# Patient Record
Sex: Male | Born: 1970 | Race: Black or African American | Hispanic: No | Marital: Single | Smoking: Never smoker
Health system: Southern US, Community
[De-identification: ages and names within clinical notes are randomized; demographics above are authoritative.]

## PROBLEM LIST (undated history)

## (undated) DIAGNOSIS — E079 Disorder of thyroid, unspecified: Secondary | ICD-10-CM

## (undated) DIAGNOSIS — I10 Essential (primary) hypertension: Secondary | ICD-10-CM

---

## 2012-03-02 ENCOUNTER — Emergency Department: Payer: Self-pay | Admitting: Emergency Medicine

## 2012-05-29 ENCOUNTER — Emergency Department: Payer: Self-pay | Admitting: *Deleted

## 2012-10-25 ENCOUNTER — Emergency Department: Payer: Self-pay | Admitting: Emergency Medicine

## 2013-09-01 ENCOUNTER — Emergency Department: Payer: Self-pay | Admitting: Emergency Medicine

## 2013-09-09 ENCOUNTER — Emergency Department: Payer: Self-pay | Admitting: Emergency Medicine

## 2013-11-28 ENCOUNTER — Emergency Department: Payer: Self-pay | Admitting: Emergency Medicine

## 2013-11-28 LAB — DRUG SCREEN, URINE
Amphetamines, Ur Screen: NEGATIVE (ref ?–1000)
BENZODIAZEPINE, UR SCRN: NEGATIVE (ref ?–200)
Barbiturates, Ur Screen: NEGATIVE (ref ?–200)
Cannabinoid 50 Ng, Ur ~~LOC~~: POSITIVE (ref ?–50)
Cocaine Metabolite,Ur ~~LOC~~: NEGATIVE (ref ?–300)
MDMA (Ecstasy)Ur Screen: NEGATIVE (ref ?–500)
Methadone, Ur Screen: NEGATIVE (ref ?–300)
OPIATE, UR SCREEN: NEGATIVE (ref ?–300)
Phencyclidine (PCP) Ur S: NEGATIVE (ref ?–25)
Tricyclic, Ur Screen: NEGATIVE (ref ?–1000)

## 2013-11-28 LAB — COMPREHENSIVE METABOLIC PANEL
ALBUMIN: 3.6 g/dL (ref 3.4–5.0)
ALK PHOS: 171 U/L — AB
ANION GAP: 5 — AB (ref 7–16)
BUN: 19 mg/dL — AB (ref 7–18)
Bilirubin,Total: 1.1 mg/dL — ABNORMAL HIGH (ref 0.2–1.0)
CALCIUM: 8.9 mg/dL (ref 8.5–10.1)
CO2: 28 mmol/L (ref 21–32)
CREATININE: 0.82 mg/dL (ref 0.60–1.30)
Chloride: 102 mmol/L (ref 98–107)
EGFR (African American): >60
EGFR (Non-African Amer.): >60
Glucose: 97 mg/dL (ref 65–99)
OSMOLALITY: 272 (ref 275–301)
Potassium: 3.9 mmol/L (ref 3.5–5.1)
SGOT(AST): 28 U/L (ref 15–37)
SGPT (ALT): 60 U/L (ref 12–78)
Sodium: 135 mmol/L — ABNORMAL LOW (ref 136–145)
TOTAL PROTEIN: 7.6 g/dL (ref 6.4–8.2)

## 2013-11-28 LAB — CBC WITH DIFFERENTIAL/PLATELET
BASOS PCT: 1.6 %
Basophil #: 0.1 10*3/uL (ref 0.0–0.1)
EOS ABS: 0.1 10*3/uL (ref 0.0–0.7)
EOS PCT: 1.2 %
HCT: 49.7 % (ref 40.0–52.0)
HGB: 16.8 g/dL (ref 13.0–18.0)
Lymphocyte #: 2.2 10*3/uL (ref 1.0–3.6)
Lymphocyte %: 43.2 %
MCH: 26.5 pg (ref 26.0–34.0)
MCHC: 33.9 g/dL (ref 32.0–36.0)
MCV: 78 fL — AB (ref 80–100)
Monocyte #: 0.8 x10 3/mm (ref 0.2–1.0)
Monocyte %: 15.1 %
Neutrophil #: 2 10*3/uL (ref 1.4–6.5)
Neutrophil %: 38.9 %
PLATELETS: 311 10*3/uL (ref 150–440)
RBC: 6.36 10*6/uL — AB (ref 4.40–5.90)
RDW: 13.2 % (ref 11.5–14.5)
WBC: 5 10*3/uL (ref 3.8–10.6)

## 2013-11-28 LAB — URINALYSIS, COMPLETE
BLOOD: NEGATIVE
Bacteria: NONE SEEN
Bilirubin,UR: NEGATIVE
Glucose,UR: NEGATIVE mg/dL (ref 0–75)
Nitrite: NEGATIVE
PH: 6 (ref 4.5–8.0)
Protein: 30
RBC,UR: 2 /HPF (ref 0–5)
Specific Gravity: 1.033 (ref 1.003–1.030)
Squamous Epithelial: 1

## 2013-11-28 LAB — T4, FREE: Free Thyroxine: 4.58 ng/dL — ABNORMAL HIGH (ref 0.76–1.46)

## 2013-11-28 LAB — TSH: Thyroid Stimulating Horm: <0.01 u[IU]/mL — ABNORMAL LOW

## 2013-11-28 LAB — TROPONIN I: TROPONIN-I: 0.02 ng/mL

## 2013-12-12 ENCOUNTER — Emergency Department: Payer: Self-pay | Admitting: Emergency Medicine

## 2013-12-12 LAB — BASIC METABOLIC PANEL
Anion Gap: 6 — ABNORMAL LOW (ref 7–16)
BUN: 18 mg/dL (ref 7–18)
CALCIUM: 8.4 mg/dL — AB (ref 8.5–10.1)
Chloride: 107 mmol/L (ref 98–107)
Co2: 27 mmol/L (ref 21–32)
Creatinine: 1 mg/dL (ref 0.60–1.30)
EGFR (Non-African Amer.): >60
GLUCOSE: 90 mg/dL (ref 65–99)
OSMOLALITY: 281 (ref 275–301)
POTASSIUM: 3.9 mmol/L (ref 3.5–5.1)
Sodium: 140 mmol/L (ref 136–145)

## 2013-12-12 LAB — CBC
HCT: 44.7 % (ref 40.0–52.0)
HGB: 15 g/dL (ref 13.0–18.0)
MCH: 26.4 pg (ref 26.0–34.0)
MCHC: 33.7 g/dL (ref 32.0–36.0)
MCV: 78 fL — ABNORMAL LOW (ref 80–100)
Platelet: 322 10*3/uL (ref 150–440)
RBC: 5.71 10*6/uL (ref 4.40–5.90)
RDW: 13.3 % (ref 11.5–14.5)
WBC: 5.9 10*3/uL (ref 3.8–10.6)

## 2013-12-12 LAB — TROPONIN I: Troponin-I: <0.02 ng/mL

## 2014-01-08 DIAGNOSIS — Z8639 Personal history of other endocrine, nutritional and metabolic disease: Secondary | ICD-10-CM | POA: Insufficient documentation

## 2014-01-08 DIAGNOSIS — F17200 Nicotine dependence, unspecified, uncomplicated: Secondary | ICD-10-CM | POA: Insufficient documentation

## 2014-07-17 ENCOUNTER — Emergency Department: Payer: Self-pay | Admitting: Emergency Medicine

## 2014-07-17 LAB — CBC
HCT: 49.3 % (ref 40.0–52.0)
HGB: 16.6 g/dL (ref 13.0–18.0)
MCH: 26.5 pg (ref 26.0–34.0)
MCHC: 33.7 g/dL (ref 32.0–36.0)
MCV: 79 fL — AB (ref 80–100)
Platelet: 284 10*3/uL (ref 150–440)
RBC: 6.26 10*6/uL — AB (ref 4.40–5.90)
RDW: 13.4 % (ref 11.5–14.5)
WBC: 15.2 10*3/uL — ABNORMAL HIGH (ref 3.8–10.6)

## 2014-07-17 LAB — COMPREHENSIVE METABOLIC PANEL
ALT: 44 U/L
Albumin: 3.5 g/dL (ref 3.4–5.0)
Alkaline Phosphatase: 161 U/L — ABNORMAL HIGH
Anion Gap: 6 — ABNORMAL LOW (ref 7–16)
BILIRUBIN TOTAL: 0.5 mg/dL (ref 0.2–1.0)
BUN: 10 mg/dL (ref 7–18)
Calcium, Total: 8.9 mg/dL (ref 8.5–10.1)
Chloride: 105 mmol/L (ref 98–107)
Co2: 26 mmol/L (ref 21–32)
Creatinine: 0.86 mg/dL (ref 0.60–1.30)
Glucose: 113 mg/dL — ABNORMAL HIGH (ref 65–99)
OSMOLALITY: 274 (ref 275–301)
Potassium: 3.8 mmol/L (ref 3.5–5.1)
SGOT(AST): 16 U/L (ref 15–37)
Sodium: 137 mmol/L (ref 136–145)
Total Protein: 6.9 g/dL (ref 6.4–8.2)

## 2014-07-17 LAB — TROPONIN I
TROPONIN-I: 0.02 ng/mL
Troponin-I: 0.03 ng/mL

## 2015-06-09 ENCOUNTER — Encounter: Payer: Self-pay | Admitting: Emergency Medicine

## 2015-06-09 ENCOUNTER — Emergency Department
Admission: EM | Admit: 2015-06-09 | Discharge: 2015-06-09 | Disposition: A | Discharge status: Home / Self Care | Payer: Self-pay | Attending: Emergency Medicine | Admitting: Emergency Medicine

## 2015-06-09 ENCOUNTER — Other Ambulatory Visit: Payer: Self-pay

## 2015-06-09 ENCOUNTER — Emergency Department: Payer: Self-pay

## 2015-06-09 DIAGNOSIS — R079 Chest pain, unspecified: Secondary | ICD-10-CM | POA: Insufficient documentation

## 2015-06-09 DIAGNOSIS — R5383 Other fatigue: Secondary | ICD-10-CM | POA: Insufficient documentation

## 2015-06-09 DIAGNOSIS — I10 Essential (primary) hypertension: Secondary | ICD-10-CM | POA: Insufficient documentation

## 2015-06-09 DIAGNOSIS — E0590 Thyrotoxicosis, unspecified without thyrotoxic crisis or storm: Secondary | ICD-10-CM | POA: Insufficient documentation

## 2015-06-09 DIAGNOSIS — M791 Myalgia, unspecified site: Secondary | ICD-10-CM

## 2015-06-09 HISTORY — DX: Disorder of thyroid, unspecified: E07.9

## 2015-06-09 HISTORY — DX: Essential (primary) hypertension: I10

## 2015-06-09 LAB — CBC
HEMATOCRIT: 50 % (ref 40.0–52.0)
Hemoglobin: 16.6 g/dL (ref 13.0–18.0)
MCH: 25.9 pg — ABNORMAL LOW (ref 26.0–34.0)
MCHC: 33.2 g/dL (ref 32.0–36.0)
MCV: 77.9 fL — AB (ref 80.0–100.0)
Platelets: 282 10*3/uL (ref 150–440)
RBC: 6.42 MIL/uL — ABNORMAL HIGH (ref 4.40–5.90)
RDW: 13.6 % (ref 11.5–14.5)
WBC: 4.9 10*3/uL (ref 3.8–10.6)

## 2015-06-09 LAB — BASIC METABOLIC PANEL
Anion gap: 6 (ref 5–15)
BUN: 16 mg/dL (ref 6–20)
CHLORIDE: 103 mmol/L (ref 101–111)
CO2: 25 mmol/L (ref 22–32)
Calcium: 9.3 mg/dL (ref 8.9–10.3)
Creatinine, Ser: 0.73 mg/dL (ref 0.61–1.24)
GFR calc Af Amer: >60 mL/min (ref >60)
GFR calc non Af Amer: >60 mL/min (ref >60)
Glucose, Bld: 105 mg/dL — ABNORMAL HIGH (ref 65–99)
POTASSIUM: 3.9 mmol/L (ref 3.5–5.1)
SODIUM: 134 mmol/L — AB (ref 135–145)

## 2015-06-09 LAB — TSH: TSH: 0.015 u[IU]/mL — ABNORMAL LOW (ref 0.350–4.500)

## 2015-06-09 LAB — TROPONIN I: Troponin I: <0.03 ng/mL (ref <0.031)

## 2015-06-09 NOTE — Discharge Instructions (Signed)
Chest Pain (Nonspecific) °It is often hard to give a specific diagnosis for the cause of chest pain. There is always a chance that your pain could be related to something serious, such as a heart attack or a blood clot in the lungs. You need to follow up with your health care provider for further evaluation. °CAUSES  °· Heartburn. °· Pneumonia or bronchitis. °· Anxiety or stress. °· Inflammation around your heart (pericarditis) or lung (pleuritis or pleurisy). °· A blood clot in the lung. °· A collapsed lung (pneumothorax). It can develop suddenly on its own (spontaneous pneumothorax) or from trauma to the chest. °· Shingles infection (herpes zoster virus). °The chest wall is composed of bones, muscles, and cartilage. Any of these can be the source of the pain. °· The bones can be bruised by injury. °· The muscles or cartilage can be strained by coughing or overwork. °· The cartilage can be affected by inflammation and become sore (costochondritis). °DIAGNOSIS  °Lab tests or other studies may be needed to find the cause of your pain. Your health care provider may have you take a test called an ambulatory electrocardiogram (ECG). An ECG records your heartbeat patterns over a 24-hour period. You may also have other tests, such as: °· Transthoracic echocardiogram (TTE). During echocardiography, sound waves are used to evaluate how blood flows through your heart. °· Transesophageal echocardiogram (TEE). °· Cardiac monitoring. This allows your health care provider to monitor your heart rate and rhythm in real time. °· Holter monitor. This is a portable device that records your heartbeat and can help diagnose heart arrhythmias. It allows your health care provider to track your heart activity for several days, if needed. °· Stress tests by exercise or by giving medicine that makes the heart beat faster. °TREATMENT  °· Treatment depends on what may be causing your chest pain. Treatment may include: °¨ Acid blockers for  heartburn. °¨ Anti-inflammatory medicine. °¨ Pain medicine for inflammatory conditions. °¨ Antibiotics if an infection is present. °· You may be advised to change lifestyle habits. This includes stopping smoking and avoiding alcohol, caffeine, and chocolate. °· You may be advised to keep your head raised (elevated) when sleeping. This reduces the chance of acid going backward from your stomach into your esophagus. °Most of the time, nonspecific chest pain will improve within 2-3 days with rest and mild pain medicine.  °HOME CARE INSTRUCTIONS  °· If antibiotics were prescribed, take them as directed. Finish them even if you start to feel better. °· For the next few days, avoid physical activities that bring on chest pain. Continue physical activities as directed. °· Do not use any tobacco products, including cigarettes, chewing tobacco, or electronic cigarettes. °· Avoid drinking alcohol. °· Only take medicine as directed by your health care provider. °· Follow your health care provider's suggestions for further testing if your chest pain does not go away. °· Keep any follow-up appointments you made. If you do not go to an appointment, you could develop lasting (chronic) problems with pain. If there is any problem keeping an appointment, call to reschedule. °SEEK MEDICAL CARE IF:  °· Your chest pain does not go away, even after treatment. °· You have a rash with blisters on your chest. °· You have a fever. °SEEK IMMEDIATE MEDICAL CARE IF:  °· You have increased chest pain or pain that spreads to your arm, neck, jaw, back, or abdomen. °· You have shortness of breath. °· You have an increasing cough, or you cough   up blood.  You have severe back or abdominal pain.  You feel nauseous or vomit.  You have severe weakness.  You faint.  You have chills. This is an emergency. Do not wait to see if the pain will go away. Get medical help at once. Call your local emergency services (911 in [place] Do not drive  yourself to the hospital. MAKE SURE YOU:   Understand these instructions.  Will watch your condition.  Will get help right away if you are not doing well or get worse. Document Released: 06/02/2005 Document Revised: 08/28/2013 Document Reviewed: 03/28/2008 [facility] Patient Information 2015 [place], [place]. This information is not intended to replace advice given to you by your health care provider. Make sure you discuss any questions you have with your health care provider.  Hyperthyroidism The thyroid is a large gland located in the lower front part of your neck. The thyroid helps control metabolism. Metabolism is how your body uses food. It controls metabolism with the hormone thyroxine. When the thyroid is overactive, it produces too much hormone. When this happens, these following problems may occur:   Nervousness  Heat intolerance  Weight loss (in spite of increase food intake)  Diarrhea  Change in hair or skin texture  Palpitations (heart skipping or having extra beats)  Tachycardia (rapid heart rate)  Loss of menstruation (amenorrhea)  Shaking of the hands CAUSES  Grave's Disease (the immune system attacks the thyroid gland). This is the most common cause.  Inflammation of the thyroid gland.  Tumor (usually benign) in the thyroid gland or elsewhere.  Excessive use of thyroid medications (both prescription and 'natural').  Excessive ingestion of Iodine. DIAGNOSIS  To prove hyperthyroidism, your caregiver may do blood tests and ultrasound tests. Sometimes the signs are hidden. It may be necessary for your caregiver to watch this illness with blood tests, either before or after diagnosis and treatment. TREATMENT Short-term treatment There are several treatments to control symptoms. Drugs called beta blockers may give some relief. Drugs that decrease hormone production will provide temporary relief in many people. These measures will usually not give permanent  relief. Definitive therapy There are treatments available which can be discussed between you and your caregiver which will permanently treat the problem. These treatments range from surgery (removal of the thyroid), to the use of radioactive iodine (destroys the thyroid by radiation), to the use of antithyroid drugs (interfere with hormone synthesis). The first two treatments are permanent and usually successful. They most often require hormone replacement therapy for life. This is because it is impossible to remove or destroy the exact amount of thyroid required to make a person euthyroid (normal). HOME CARE INSTRUCTIONS  See your caregiver if the problems you are being treated for get worse. Examples of this would be the problems listed above. SEEK MEDICAL CARE IF: Your general condition worsens. MAKE SURE YOU:   Understand these instructions.  Will watch your condition.  Will get help right away if you are not doing well or get worse. Document Released: 08/23/2005 Document Revised: 11/15/2011 Document Reviewed: 01/04/2007 [facility] Patient Information 2015 [place], [place]. This information is not intended to replace advice given to you by your health care provider. Make sure you discuss any questions you have with your health care provider.

## 2015-06-09 NOTE — ED Notes (Signed)
Pt to ED with c/o midsternal cp that is worse with inspiration, also having some upper back pain, states he is supposed to take thyroid medication but has not had any in a while

## 2015-06-09 NOTE — ED Provider Notes (Signed)
[facility] Emergency Department Provider Note  ____________________________________________  Time seen: 1726  I have reviewed the triage vital signs and the nursing notes.   HISTORY  Chief Complaint Chest Pain     HPI Jesse Sutton is a 44 y.o. male  who reports she's had chest pain for the past 3 days. He also has some discomfort and his middle back. He feels fatigued, run down, and overall just not feeling well. He also has pain in his right shoulder when he lifts his arm up. He complains of needing to yawn or take a deep breath on occasion, but otherwise does not complain about right shortness of breath. He does have a history of a thyroid problem. It is unclear whether it is hypo-or hyper thyroid, but he does report having taken methimazole in the past which makes me suspicious for hyperthyroidism. He has not been taking any medication for this recently.    Past Medical History  Diagnosis Date  . Hypertension   . Thyroid disease     There are no active problems to display for this patient.   History reviewed. No pertinent past surgical history.  No current outpatient prescriptions on file.  Allergies Shellfish allergy  No family history on file.  Social History Social History  Substance Use Topics  . Smoking status: Never Smoker   . Smokeless tobacco: None  . Alcohol Use: Yes    Review of Systems  Constitutional: Negative for fever. ENT: Negative for sore throat. Cardiovascular: Positive for chest pain Respiratory: Negative for shortness of breath. Gastrointestinal: Negative for abdominal pain, vomiting and diarrhea. Genitourinary: Negative for dysuria. Musculoskeletal: Pain when he rotates and his middle back and discomfort in his right shoulder when he raises the arm.. Skin: Negative for rash. Neurological: Negative for headaches   10-point ROS otherwise negative.  ____________________________________________   PHYSICAL  EXAM:  VITAL SIGNS: ED Triage Vitals  Enc Vitals Group     BP 06/09/15 1421 151/84 mmHg     Pulse Rate 06/09/15 1421 55     Resp 06/09/15 1421 18     Temp 06/09/15 1421 98.8 F (37.1 C)     Temp Source 06/09/15 1421 Oral     SpO2 06/09/15 1421 98 %     Weight 06/09/15 1421 190 lb (86.183 kg)     Height 06/09/15 1421  (1.803 m)     Head Cir --      Peak Flow --      Pain Score 06/09/15 1421 8     Pain Loc --      Pain Edu? --      Excl. in GC? --     Constitutional: Alert and oriented. Well appearing and in no distress. ENT   Head: Normocephalic and atraumatic.   Nose: No congestion/rhinnorhea.   Mouth/Throat: Mucous membranes are moist. Cardiovascular: Normal rate, regular rhythm, no murmur noted Respiratory:  Normal respiratory effort, no tachypnea.    Breath sounds are clear and equal bilaterally.  Gastrointestinal: Soft and nontender. No distention.  Back: Mild tightness with discomfort on palpation and with rotation of his torso area no bony tenderness. no CVA tenderness. Musculoskeletal: No deformity noted. The patient appears to have a normal range of motion in all extremities, but complains of discomfort in the right shoulder, worse on palpation.  No noted edema.  Neurologic:  Normal speech and [facility]ag[facility] Hospital[facility]icits are appreciated.  Skin:  Skin is warm, dry. No rash  noted. Psychiatric: Mood and affect are normal. Speech and behavior are normal.  ____________________________________________    LABS (pertinent positives/negatives)  Labs Reviewed  BASIC METABOLIC PANEL - Abnormal; Notable for the following:    Sodium 134 (*)    Glucose, Bld 105 (*)    All other components within normal limits  CBC - Abnormal; Notable for the following:    RBC 6.42 (*)    MCV 77.9 (*)    MCH 25.9 (*)    All other components within normal limits  TSH - Abnormal; Notable for the following:    TSH 0.015 (*)    All other components within  normal limits  TROPONIN I     ____________________________________________   EKG  ED ECG REPORT I, Maveryk Renstrom W, the attending physician, personally viewed and interpreted this ECG.   Date: 06/09/2015  EKG Time: 1416  Rate: 57  Rhythm: Sinus bradycardia  Axis: Normal  Intervals: Normal  ST&T Change: Slightly bimodal appearance of T-wave in lead 3. Slight elevation of the ST segment in the 1 and V2 with normal morphology and not in the appearance of an ischemic change.   ____________________________________________    RADIOLOGY   Chest x-ray:  FINDINGS: The heart size and mediastinal contours are within normal limits. Both lungs are clear. Lung volumes are normal. The visualized skeletal structures are unremarkable.  IMPRESSION: Normal chest x-ray.  ____________________________________________   INITIAL IMPRESSION / ASSESSMENT AND PLAN / ED COURSE  Pertinent labs & imaging results that were available during my care of the patient were reviewed by me and considered in my medical decision making (see chart for details).   Pleasant, well-appearing, 44 year old male in no acute distress. He complains of some chest discomfort and general soreness and myalgias. His troponin is negative. His EKG does not show any ischemic findings. My suspicion for cardiac disease is very low. I have added on a TSH level and will await on the results of that test, but as long as it isn't within reason, the patient appears stable for discharge and follow-up with his primary physicians at [facility] clinic.  ----------------------------------------- 7:27 PM on 06/09/2015 -----------------------------------------  The TSH took a while for the results to return. I needed to call the lab to ask about the result. Finally resulted at a low level of 0.015. The patient certainly does not appear to be in any sort of thyroid storm or acute situation.   I have shared the results with the  patient and his family and asked him to follow-up with his regular doctors at [facility].   ____________________________________________   FINAL CLINICAL IMPRESSION(S) / ED DIAGNOSES  Final diagnoses:  Chest pain, unspecified chest pain type  Hyperthyroidism  Myalgia      Darien Ramus, MD 06/09/15 1930

## 2015-06-09 NOTE — ED Notes (Signed)
Pt reports chest /back pain for the last three days along with stating" I just don't feel well"

## 2015-06-19 DIAGNOSIS — F1210 Cannabis abuse, uncomplicated: Secondary | ICD-10-CM | POA: Insufficient documentation

## 2016-05-31 ENCOUNTER — Other Ambulatory Visit: Payer: Self-pay | Admitting: Emergency Medicine

## 2016-05-31 ENCOUNTER — Emergency Department (HOSPITAL_COMMUNITY): Payer: Self-pay

## 2016-05-31 ENCOUNTER — Emergency Department (HOSPITAL_COMMUNITY)
Admission: EM | Admit: 2016-05-31 | Discharge: 2016-05-31 | Disposition: A | Discharge status: Home / Self Care | Payer: Self-pay | Attending: Emergency Medicine | Admitting: Emergency Medicine

## 2016-05-31 DIAGNOSIS — S21211A Laceration without foreign body of right back wall of thorax without penetration into thoracic cavity, initial encounter: Secondary | ICD-10-CM | POA: Insufficient documentation

## 2016-05-31 DIAGNOSIS — W3400XA Accidental discharge from unspecified firearms or gun, initial encounter: Secondary | ICD-10-CM | POA: Insufficient documentation

## 2016-05-31 DIAGNOSIS — S21231A Puncture wound without foreign body of right back wall of thorax without penetration into thoracic cavity, initial encounter: Secondary | ICD-10-CM

## 2016-05-31 DIAGNOSIS — Y999 Unspecified external cause status: Secondary | ICD-10-CM | POA: Insufficient documentation

## 2016-05-31 DIAGNOSIS — Z23 Encounter for immunization: Secondary | ICD-10-CM | POA: Insufficient documentation

## 2016-05-31 DIAGNOSIS — Y939 Activity, unspecified: Secondary | ICD-10-CM | POA: Insufficient documentation

## 2016-05-31 DIAGNOSIS — Y929 Unspecified place or not applicable: Secondary | ICD-10-CM | POA: Insufficient documentation

## 2016-05-31 LAB — PREPARE FRESH FROZEN PLASMA
UNIT DIVISION: 0
UNIT DIVISION: 0

## 2016-05-31 LAB — BLOOD PRODUCT ORDER (VERBAL) VERIFICATION

## 2016-05-31 MED ORDER — OXYCODONE-ACETAMINOPHEN 5-325 MG PO TABS: 1.0000 | ORAL_TABLET | ORAL | 0 refills | Status: AC | PRN

## 2016-05-31 MED ORDER — CEFAZOLIN SODIUM-DEXTROSE 2-4 GM/100ML-% IV SOLN
2.0000 g | Freq: Once | INTRAVENOUS | Status: AC
  Administered 2016-05-31: 2 g via INTRAVENOUS

## 2016-05-31 MED ORDER — CEFAZOLIN SODIUM-DEXTROSE 2-4 GM/100ML-% IV SOLN
INTRAVENOUS | Status: AC
  Administered 2016-05-31: 2 g via INTRAVENOUS
  Filled 2016-05-31: qty 100

## 2016-05-31 MED ORDER — CEPHALEXIN 500 MG PO CAPS: 500.0000 mg | ORAL_CAPSULE | Freq: Four times a day (QID) | ORAL | 0 refills | Status: AC

## 2016-05-31 MED ORDER — MORPHINE SULFATE (PF) 4 MG/ML IV SOLN
4.0000 mg | Freq: Once | INTRAVENOUS | Status: AC
Start: 2016-05-31 — End: 2016-05-31
  Administered 2016-05-31: 4 mg via INTRAVENOUS

## 2016-05-31 MED ORDER — TETANUS-DIPHTH-ACELL PERTUSSIS 5-2.5-18.5 LF-MCG/0.5 IM SUSP
INTRAMUSCULAR | Status: AC
  Filled 2016-05-31: qty 0.5

## 2016-05-31 MED ORDER — IOPAMIDOL (ISOVUE-300) INJECTION 61%
100.0000 mL | Freq: Once | INTRAVENOUS | Status: AC | PRN
  Administered 2016-05-31: 100 mL via INTRAVENOUS

## 2016-05-31 MED ORDER — TETANUS-DIPHTH-ACELL PERTUSSIS 5-2.5-18.5 LF-MCG/0.5 IM SUSP
0.5000 mL | Freq: Once | INTRAMUSCULAR | Status: AC
  Administered 2016-05-31: 0.5 mL via INTRAMUSCULAR

## 2016-05-31 MED ORDER — MORPHINE SULFATE (PF) 4 MG/ML IV SOLN
INTRAVENOUS | Status: AC
  Filled 2016-05-31: qty 1
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
## 2016-05-31 NOTE — Progress Notes (Signed)
   05/31/16 0500  Clinical Encounter Type  Visited With Patient  Visit Type ED  Referral From Nurse  Spiritual Encounters  Spiritual Needs Emotional  Stress Factors  Patient Stress Factors Health changes;Major life changes  Chaplain responded to page, patient is alert receiving treatment, mother was contacted by other personnel, provided spiritual presence and support to patient and staff. Patient was taken right away for scanning. Chaplain will follow up as needed.

## 2016-05-31 NOTE — Discharge Instructions (Signed)
Watch for signs of infection - redness, drainage, increased pain. If any of these appear, return to the ED.

## 2016-06-07 ENCOUNTER — Telehealth (HOSPITAL_COMMUNITY): Payer: Self-pay

## 2016-06-07 ENCOUNTER — Encounter: Payer: Self-pay | Admitting: Orthopedic Surgery

## 2016-06-07 NOTE — Telephone Encounter (Signed)
Referred to [facility] as he'll be a new pt and will need to see Dr. Lindie SpruceWyatt or Janee Mornhompson.

## 2019-01-02 ENCOUNTER — Encounter: Payer: Self-pay | Admitting: Emergency Medicine

## 2019-01-02 ENCOUNTER — Emergency Department: Payer: No Typology Code available for payment source

## 2019-01-02 ENCOUNTER — Emergency Department
Admission: EM | Admit: 2019-01-02 | Discharge: 2019-01-03 | Disposition: A | Discharge status: Home / Self Care | Payer: No Typology Code available for payment source | Attending: Emergency Medicine | Admitting: Emergency Medicine

## 2019-01-02 ENCOUNTER — Other Ambulatory Visit: Payer: Self-pay

## 2019-01-02 DIAGNOSIS — Y9389 Activity, other specified: Secondary | ICD-10-CM | POA: Diagnosis not present

## 2019-01-02 DIAGNOSIS — X500XXA Overexertion from strenuous movement or load, initial encounter: Secondary | ICD-10-CM | POA: Diagnosis not present

## 2019-01-02 DIAGNOSIS — Y929 Unspecified place or not applicable: Secondary | ICD-10-CM | POA: Diagnosis not present

## 2019-01-02 DIAGNOSIS — Z79899 Other long term (current) drug therapy: Secondary | ICD-10-CM | POA: Diagnosis not present

## 2019-01-02 DIAGNOSIS — I10 Essential (primary) hypertension: Secondary | ICD-10-CM | POA: Diagnosis not present

## 2019-01-02 DIAGNOSIS — Y990 Civilian activity done for income or pay: Secondary | ICD-10-CM | POA: Diagnosis not present

## 2019-01-02 DIAGNOSIS — S233XXA Sprain of ligaments of thoracic spine, initial encounter: Secondary | ICD-10-CM | POA: Insufficient documentation

## 2019-01-02 DIAGNOSIS — S299XXA Unspecified injury of thorax, initial encounter: Secondary | ICD-10-CM | POA: Diagnosis present

## 2019-01-02 DIAGNOSIS — S239XXA Sprain of unspecified parts of thorax, initial encounter: Secondary | ICD-10-CM

## 2019-01-02 NOTE — ED Triage Notes (Signed)
Patient ambulatory to triage with steady gait, without difficulty or distress noted; pt reports while at work was picking up on a shaft, went to stand up then began having pain to mid back that increases with any movement (pt employed with [employer] thru [employer]; workers comp profile indicates UDS required; EDT Maralyn Sago to triage to complete

## 2019-01-03 MED ORDER — HYDROCODONE-ACETAMINOPHEN 5-325 MG PO TABS
1.0000 | ORAL_TABLET | Freq: Once | ORAL | Status: AC
  Administered 2019-01-03: 1 via ORAL
  Filled 2019-01-03: qty 1

## 2019-01-03 MED ORDER — METHOCARBAMOL 500 MG PO TABS: 500.0000 mg | ORAL_TABLET | Freq: Four times a day (QID) | ORAL | 0 refills | Status: AC

## 2019-01-03 MED ORDER — MELOXICAM 15 MG PO TABS: 15.0000 mg | ORAL_TABLET | Freq: Every day | ORAL | 0 refills | Status: AC

## 2019-01-03 MED ORDER — METHOCARBAMOL 500 MG PO TABS
1000.0000 mg | ORAL_TABLET | Freq: Once | ORAL | Status: AC
  Administered 2019-01-03: 1000 mg via ORAL
  Filled 2019-01-03: qty 2

## 2019-01-03 MED ORDER — MELOXICAM 7.5 MG PO TABS
15.0000 mg | ORAL_TABLET | Freq: Once | ORAL | Status: AC
  Administered 2019-01-03: 15 mg via ORAL
  Filled 2019-01-03: qty 2

## 2019-01-03 NOTE — ED Provider Notes (Signed)
[facility] Emergency Department Provider Note  ____________________________________________  Time seen: Approximately 12:15 AM  I have reviewed the triage vital signs and the nursing notes.   HISTORY  Chief Complaint Back Pain    HPI Jesse Sutton is a 48 y.o. male who presents emergency department complaining of mid back pain after work-related injury.  Patient reports that he was at work, lifting a heavy metal shaft when he felt a sudden pull, sharp pain in his lower back.  Patient reports that the pain was so sharp he was unable to straighten, reach for his radio to call for help for a minute.  Patient was ultimately able to straighten up.  He had no bowel or bladder dysfunction, saddle anesthesia or paresthesias.  Patient denies any direct trauma to the back other than lifting.  He denies any radicular symptoms in the upper or lower extremity.  No urinary or GI complaints at this time.  Patient is tried 2 Aleve with no relief.         Past Medical History:  Diagnosis Date  . Hypertension   . Thyroid disease     There are no active problems to display for this patient.   History reviewed. No pertinent surgical history.  Prior to Admission medications   Medication Sig Start Date End Date Taking? Authorizing Provider  cephALEXin (KEFLEX) 500 MG capsule Take 1 capsule (500 mg total) by mouth 4 (four) times daily. 05/31/16   Dione Booze, MD  meloxicam (MOBIC) 15 MG tablet Take 1 tablet (15 mg total) by mouth daily. 01/03/19   [place]aic Welliver, Delorise Royals, PA-C  methocarbamol (ROBAXIN) 500 MG tablet Take 1 tablet (500 mg total) by mouth 4 (four) times daily. 01/03/19   Kamani Lewter, Delorise Royals, PA-C  oxyCODONE-acetaminophen (PERCOCET) 5-325 MG tablet Take 1 tablet by mouth every 4 (four) hours as needed for moderate pain. 05/31/16   Dione Booze, MD    Allergies Shellfish allergy and Shellfish allergy  No family history on file.  Social History Social History    Tobacco Use  . Smoking status: Never Smoker  . Smokeless tobacco: Never Used  Substance Use Topics  . Alcohol use: Yes  . Drug use: Yes    Types: Marijuana     Review of Systems  Constitutional: No fever/chills Eyes: No visual changes.  Cardiovascular: no chest pain. Respiratory: no cough. No SOB. Gastrointestinal: No abdominal pain.  No nausea, no vomiting.  No diarrhea.  No constipation. Genitourinary: Negative for dysuria. No hematuria Musculoskeletal: Positive for mid back pain.  No radiation. Skin: Negative for rash, abrasions, lacerations, ecchymosis. Neurological: Negative for headaches, focal weakness or numbness. 10-point ROS otherwise negative.  ____________________________________________   PHYSICAL EXAM:  VITAL SIGNS: ED Triage Vitals  Enc Vitals Group     BP 01/02/19 2019 (!) 170/100     Pulse Rate 01/02/19 2019 87     Resp 01/02/19 2019 20     Temp 01/02/19 2019 97.9 F (36.6 C)     Temp Source 01/02/19 2019 Oral     SpO2 01/02/19 2019 97 %     Weight 01/02/19 2018 210 lb (95.3 kg)     Height 01/02/19 2018 6' (1.829 m)     Head Circumference --      Peak Flow --      Pain Score 01/02/19 2018 10     Pain Loc --      Pain Edu? --      Excl. in GC? --  Constitutional: Alert and oriented. Well appearing and in no acute distress. Eyes: Conjunctivae are normal. PERRL. EOMI. Head: Atraumatic. ENT:      Ears:       Nose: No congestion/rhinnorhea.      Mouth/Throat: Mucous membranes are moist.  Neck: No stridor.  No cervical spine tenderness to palpation.  Cardiovascular: Normal rate, regular rhythm. Normal S1 and S2.  Good peripheral circulation. Respiratory: Normal respiratory effort without tachypnea or retractions. Lungs CTAB. Good air entry to the bases with no decreased or absent breath sounds. Gastrointestinal: Bowel sounds 4 quadrants. Soft and nontender to palpation. No guarding or rigidity. No palpable masses. No distention. No CVA  tenderness. Musculoskeletal: Full range of motion to all extremities. No gross deformities appreciated.  Examination of the thoracic and lumbar spine reveals no gross deformity or signs of trauma.  Patient is able to extend, flex the back appropriately.  Patient is tender to palpation diffusely in vertebrae T7-11.  No palpable abnormality or step-off.  Patient has no tenderness to palpation of the left paraspinal muscle group but diffuse tenderness to palpation throughout the right.  Examination of the lumbar spine is unremarkable.  No tenderness to palpation over her sciatic notch.  Negative straight leg raise.  Dorsalis pedis pulse intact bilateral lower extremities.  Sensation intact and equal bilateral lower extremities. Neurologic:  Normal speech and language. No gross focal neurologic deficits are appreciated.  Skin:  Skin is warm, dry and intact. No rash noted. Psychiatric: Mood and affect are normal. Speech and behavior are normal. Patient exhibits appropriate insight and judgement.   ____________________________________________   LABS (all labs ordered are listed, but only abnormal results are displayed)  Labs Reviewed - No data to display ____________________________________________  EKG   ____________________________________________  RADIOLOGY I personally viewed and evaluated these images as part of my medical decision making, as well as reviewing the written report by the radiologist.  Dg Thoracic Spine 2 View  Result Date: 01/03/2019 CLINICAL DATA:  Initial evaluation for acute back pain status post lifting injury. EXAM: THORACIC SPINE 2 VIEWS COMPARISON:  None. FINDINGS: Mild thoracolumbar dextroscoliosis. Alignment otherwise normal with preservation of the normal thoracic kyphosis. No listhesis or malalignment. Vertebral body heights maintained without evidence for acute or chronic fracture. Degenerative changes noted within the partially visualized cervical spine. Sequelae  of prior ORIF noted about the humerus. Visualized soft tissues within normal limits. IMPRESSION: No radiographic evidence for acute abnormality within the thoracic spine. Electronically Signed   By: Rise MuBenjamin  McClintock M.D.   On: 01/03/2019 00:01    ____________________________________________    PROCEDURES  Procedure(s) performed:    Procedures    Medications  HYDROcodone-acetaminophen (NORCO/VICODIN) 5-325 MG per tablet 1 tablet (1 tablet Oral Given 01/03/19 0028)  meloxicam (MOBIC) tablet 15 mg (15 mg Oral Given 01/03/19 0028)  methocarbamol (ROBAXIN) tablet 1,000 mg (1,000 mg Oral Given 01/03/19 0028)     ____________________________________________   INITIAL IMPRESSION / ASSESSMENT AND PLAN / ED COURSE  Pertinent labs & imaging results that were available during my care of the patient were reviewed by me and considered in my medical decision making (see chart for details).  Review of the [place] CSRS was performed in accordance of the [facility] prior to dispensing any controlled drugs.           Patient's diagnosis is consistent with acute thoracic back sprain.  Patient presented to the emergency department with thoracic back pain after lifting a heavy item at work.  Overall, exam  was reassuring with no neurological deficits.  Imaging reveals no fractures or acute disc space loss on x-ray.  Patient is given anti-inflammatories and muscle relaxers for symptom relief.  Patient is to follow-up with neurosurgery.  No further work-up at this time..  Patient is given ED precautions to return to the ED for any worsening or new symptoms.     ____________________________________________  FINAL CLINICAL IMPRESSION(S) / ED DIAGNOSES  Final diagnoses:  Thoracic back sprain, initial encounter      NEW MEDICATIONS STARTED DURING THIS VISIT:  ED Discharge Orders         Ordered    meloxicam (MOBIC) 15 MG tablet  Daily     01/03/19 0027    methocarbamol (ROBAXIN) 500 MG tablet  4  times daily     01/03/19 0027              This chart was dictated using voice recognition software/Dragon. Despite best efforts to proofread, errors can occur which can change the meaning. Any change was purely unintentional.    Racheal Patches, PA-C 01/03/19 0029    Jeanmarie Plant, MD 01/06/19 [phone]

## 2019-10-24 IMAGING — CR THORACIC SPINE 2 VIEWS
1 series · 3 of 3 positions shown · non-contrast
Comparison: None.

CLINICAL DATA: Initial evaluation for acute back pain status post
lifting injury.

EXAM:
THORACIC SPINE 2 VIEWS

[Series 1: dg thoracic spine 2 view · 0.14mm/px · 3 of 3 slices shown]
[im 1/3]
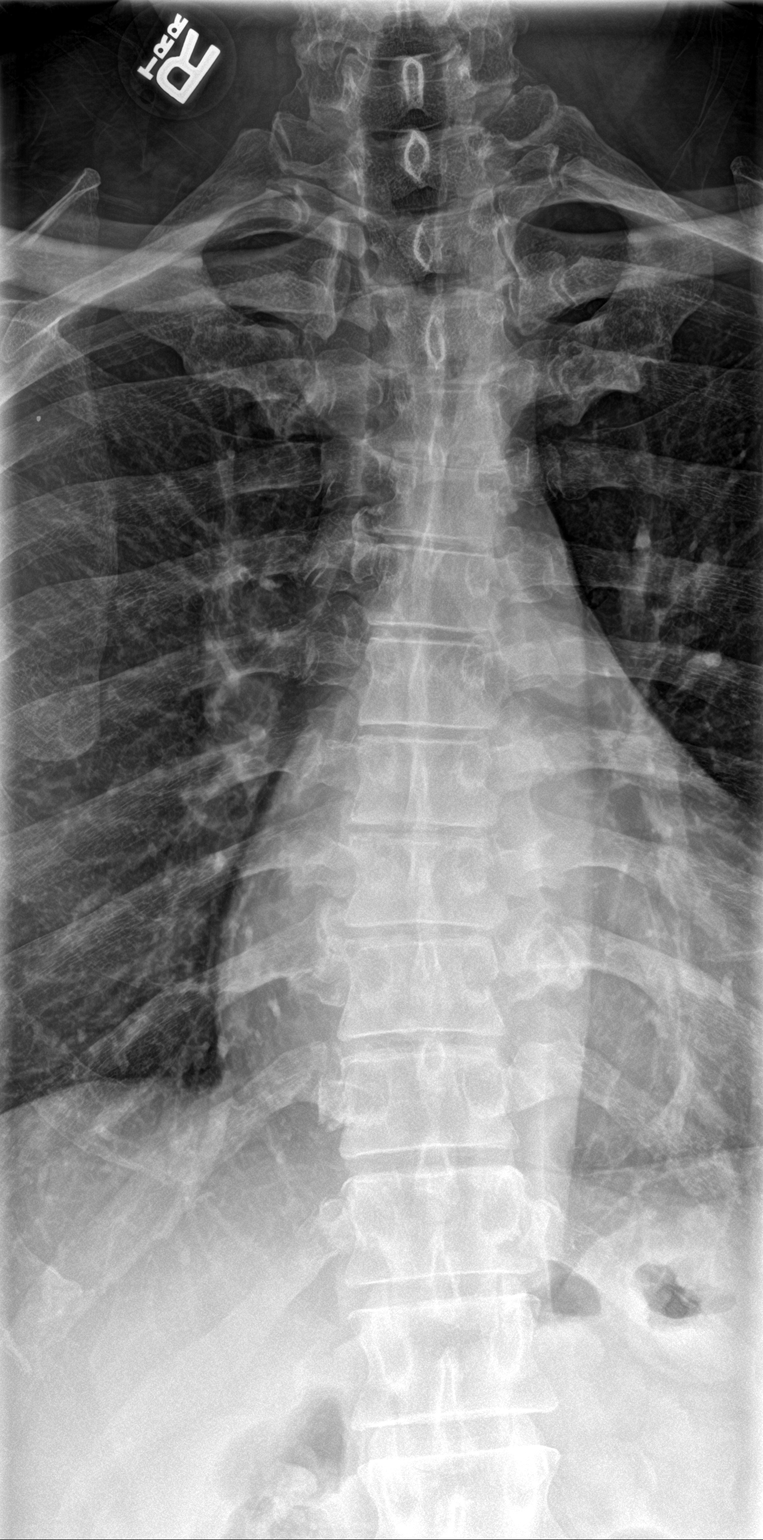
[im 2/3]
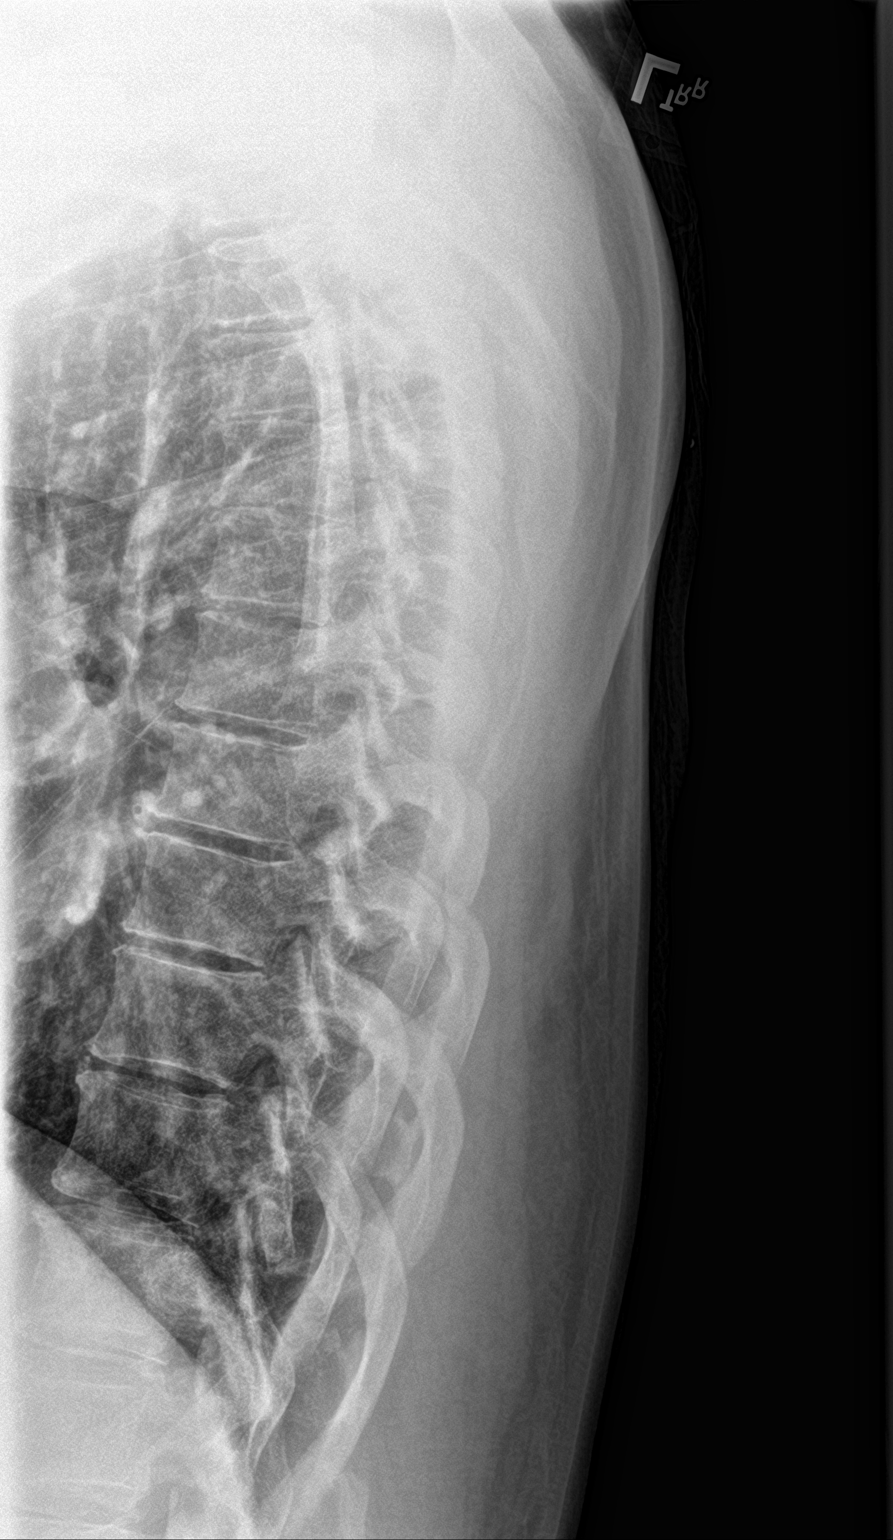
[im 3/3]
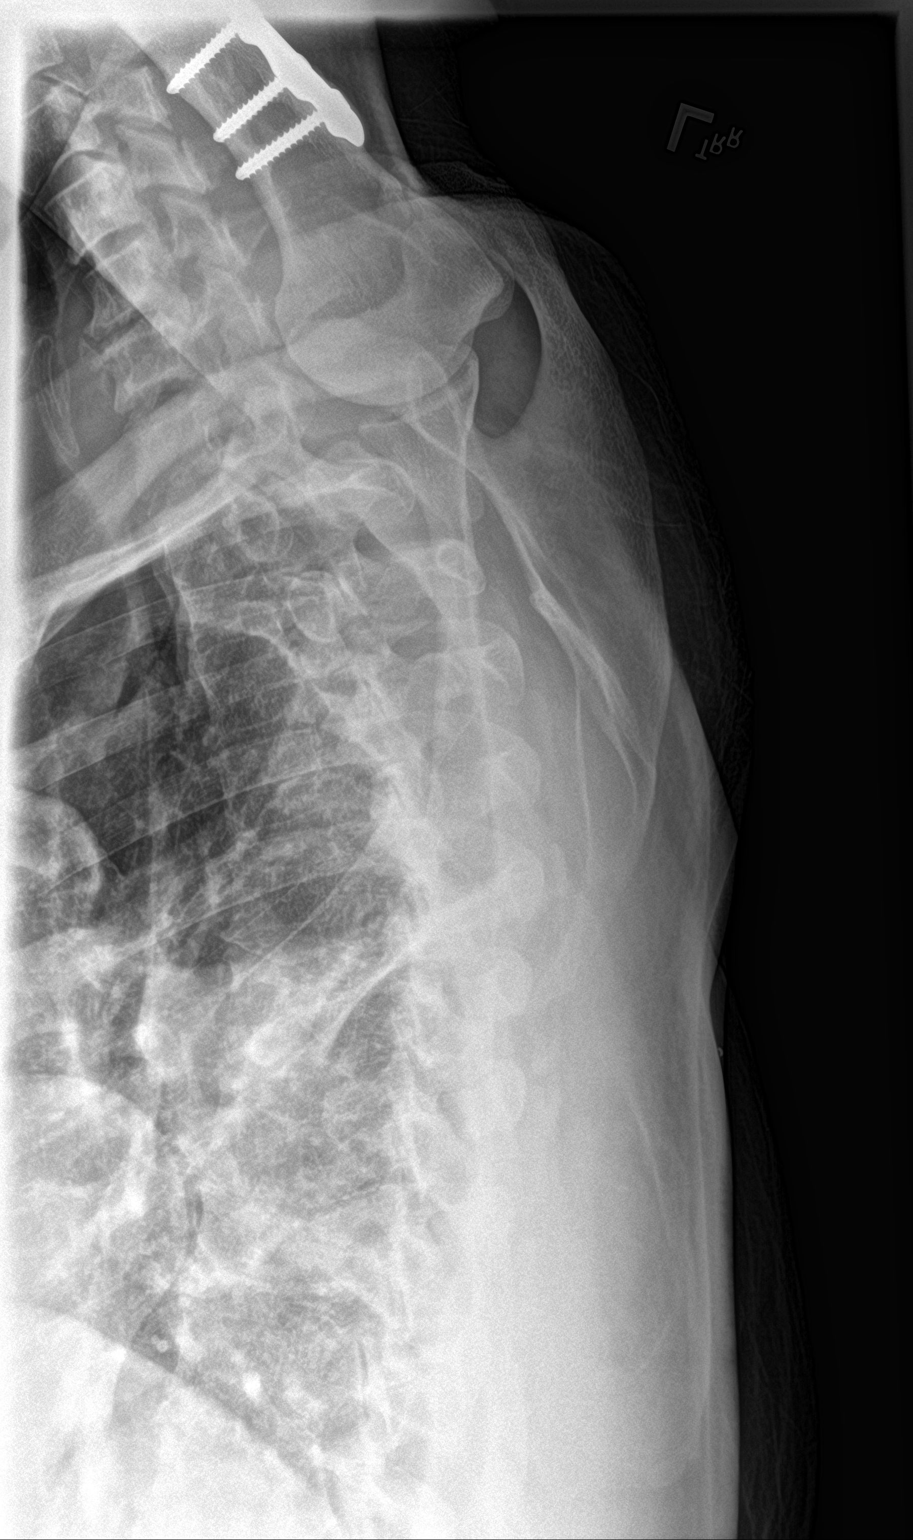

[3 of 3 positions shown; findings below may reference images not displayed]

FINDINGS: Mild thoracolumbar dextroscoliosis. Alignment otherwise normal with
preservation of the normal thoracic kyphosis. No listhesis or
malalignment. Vertebral body heights maintained without evidence for
acute or chronic fracture. Degenerative changes noted within the
partially visualized cervical spine. Sequelae of prior ORIF noted
about the humerus. Visualized soft tissues within normal limits.
IMPRESSION: No radiographic evidence for acute abnormality within the thoracic
spine.

## 2020-02-19 DIAGNOSIS — I10 Essential (primary) hypertension: Secondary | ICD-10-CM | POA: Insufficient documentation

## 2020-12-09 DIAGNOSIS — F1010 Alcohol abuse, uncomplicated: Secondary | ICD-10-CM | POA: Insufficient documentation

## 2021-05-19 DIAGNOSIS — R7989 Other specified abnormal findings of blood chemistry: Secondary | ICD-10-CM | POA: Insufficient documentation

## 2021-05-19 DIAGNOSIS — R1310 Dysphagia, unspecified: Secondary | ICD-10-CM | POA: Insufficient documentation

## 2021-05-19 DIAGNOSIS — I5032 Chronic diastolic (congestive) heart failure: Secondary | ICD-10-CM | POA: Insufficient documentation

## 2021-05-19 DIAGNOSIS — R0789 Other chest pain: Secondary | ICD-10-CM | POA: Insufficient documentation

## 2021-05-19 DIAGNOSIS — I161 Hypertensive emergency: Secondary | ICD-10-CM | POA: Insufficient documentation

## 2022-03-25 DIAGNOSIS — Z789 Other specified health status: Secondary | ICD-10-CM | POA: Insufficient documentation

## 2022-03-25 DIAGNOSIS — Z0000 Encounter for general adult medical examination without abnormal findings: Secondary | ICD-10-CM | POA: Insufficient documentation

## 2023-10-12 ENCOUNTER — Ambulatory Visit
Admission: EM | Admit: 2023-10-12 | Discharge: 2023-10-12 | Disposition: A | Discharge status: Home / Self Care | Payer: BC Managed Care – PPO

## 2023-10-12 DIAGNOSIS — F1010 Alcohol abuse, uncomplicated: Secondary | ICD-10-CM

## 2023-10-12 DIAGNOSIS — I10 Essential (primary) hypertension: Secondary | ICD-10-CM | POA: Diagnosis not present

## 2023-10-12 DIAGNOSIS — R001 Bradycardia, unspecified: Secondary | ICD-10-CM

## 2023-10-12 DIAGNOSIS — E0590 Thyrotoxicosis, unspecified without thyrotoxic crisis or storm: Secondary | ICD-10-CM | POA: Insufficient documentation

## 2023-10-12 MED ORDER — AMLODIPINE BESYLATE 5 MG PO TABS: 5.0000 mg | ORAL_TABLET | Freq: Every day | ORAL | 0 refills | Status: AC

## 2023-10-12 NOTE — ED Provider Notes (Signed)
 [facility]    CSN: [id number] Arrival date & time: 10/12/23  1709      History   Chief Complaint Chief Complaint  Patient presents with   Hypertension    HPI Jesse Sutton is a 53 y.o. male with history of alcohol abuse, bradycardia, thyroid  disease, hypertension, and CHF. He presents today for blood pressure concerns.   Patient reports that his BP is typically 150s systolic. He reports that he drank alcohol yesterday and had a headache today. He checked his BP today and it was 180's systolic. He has been taking Losartan daily as prescribed and denies any other BP meds.  Currently he denies headache. He took Tylenol  and that helped his headache. Denies vision changes, dizziness, chest pain, numbness, palpitations, leg swelling or SOB.   Patient has a follow up appointment with PCP on 10/27/23.   HPI  Past Medical History:  Diagnosis Date   Hypertension    Thyroid  disease     Patient Active Problem List   Diagnosis Date Noted   Hyperthyroidism 10/12/2023   Alcohol use 03/25/2022   General medical exam 03/25/2022   Chronic heart failure with preserved ejection fraction (HCC) 05/19/2021   Dysphagia 05/19/2021   Elevated troponin 05/19/2021   Hypertensive emergency 05/19/2021   Other chest pain 05/19/2021   Chronic alcohol abuse 12/09/2020   Hypertension 02/19/2020   Cannabis abuse 06/19/2015   History of hyperthyroidism 01/08/2014   Nicotine dependence, uncomplicated 01/08/2014    History reviewed. No pertinent surgical history.     Home Medications    Prior to Admission medications   Medication Sig Start Date End Date Taking? Authorizing Provider  amLODipine  (NORVASC ) 5 MG tablet Take 1 tablet (5 mg total) by mouth daily. 10/12/23  Yes Arvis Jolan NOVAK, PA-C  levothyroxine (SYNTHROID) 125 MCG tablet Take 1 tablet by mouth daily. 09/13/23  Yes [provider]  losartan (COZAAR) 100 MG tablet Take 1 tablet by mouth daily. 10/18/22 10/18/23 Yes  [provider]  cephALEXin  (KEFLEX ) 500 MG capsule Take 1 capsule (500 mg total) by mouth 4 (four) times daily. 05/31/16   Raford Lenis, MD  meloxicam  (MOBIC ) 15 MG tablet Take 1 tablet (15 mg total) by mouth daily. 01/03/19   Cuthriell, Dorn JONETTA, PA-C  methocarbamol  (ROBAXIN ) 500 MG tablet Take 1 tablet (500 mg total) by mouth 4 (four) times daily. 01/03/19   Cuthriell, Dorn JONETTA, PA-C  oxyCODONE -acetaminophen  (PERCOCET) 5-325 MG tablet Take 1 tablet by mouth every 4 (four) hours as needed for moderate pain. 05/31/16   Raford Lenis, MD    Family History History reviewed. No pertinent family history.  Social History Social History   Tobacco Use   Smoking status: Never   Smokeless tobacco: Never  Substance Use Topics   Alcohol use: Yes   Drug use: Yes    Types: Marijuana     Allergies   Methimazole, Shellfish allergy, and Shellfish allergy   Review of Systems Review of Systems  Constitutional:  Negative for fatigue.  Eyes:  Negative for visual disturbance.  Respiratory:  Negative for shortness of breath and wheezing.   Cardiovascular:  Negative for chest pain, palpitations and leg swelling.  Gastrointestinal:  Negative for nausea and vomiting.  Neurological:  Negative for dizziness, weakness and headaches.  Psychiatric/Behavioral:  Negative for confusion.      Physical Exam Triage Vital Signs ED Triage Vitals  Encounter Vitals Group     BP 10/12/23 1809 (!) 181/94     Systolic  BP Percentile --      Diastolic BP Percentile --      Pulse Rate 10/12/23 1809 (!) 50     Resp 10/12/23 1809 16     Temp 10/12/23 1809 98.6 F (37 C)     Temp Source 10/12/23 1809 Oral     SpO2 10/12/23 1809 97 %     Weight --      Height --      Head Circumference --      Peak Flow --      Pain Score 10/12/23 1808 0     Pain Loc --      Pain Education --      Exclude from Growth Chart --    No data found.  Updated Vital Signs BP (!) 186/95 (BP Location: Left Arm)   Pulse  (!) 50   Temp 98.6 F (37 C) (Oral)   Resp 16   SpO2 97%     06/22/23: 170/79 06/13/23: 176/88 04/14/23: 182/91 12/14/22: 156/77  Physical Exam Vitals and nursing note reviewed.  Constitutional:      General: He is not in acute distress.    Appearance: Normal appearance. He is well-developed. He is not ill-appearing.  HENT:     Head: Normocephalic and atraumatic.  Eyes:     General: No scleral icterus.    Conjunctiva/sclera: Conjunctivae normal.  Cardiovascular:     Rate and Rhythm: Regular rhythm. Bradycardia present.  Pulmonary:     Effort: Pulmonary effort is normal. No respiratory distress.     Breath sounds: Normal breath sounds.  Abdominal:     Palpations: Abdomen is soft.     Tenderness: There is no abdominal tenderness.  Musculoskeletal:     Cervical back: Neck supple.  Skin:    General: Skin is warm and dry.     Capillary Refill: Capillary refill takes less than 2 seconds.  Neurological:     General: No focal deficit present.     Mental Status: He is alert. Mental status is at baseline.     Motor: No weakness.     Gait: Gait normal.  Psychiatric:        Mood and Affect: Mood normal.        Behavior: Behavior normal.      UC Treatments / Results  Labs (all labs ordered are listed, but only abnormal results are displayed) Labs Reviewed - No data to display  EKG   Radiology No results found.  Procedures Procedures (including critical care time)  Medications Ordered in UC Medications - No data to display  Initial Impression / Assessment and Plan / UC Course  I have reviewed the triage vital signs and the nursing notes.  Pertinent labs & imaging results that were available during my care of the patient were reviewed by me and considered in my medical decision making (see chart for details).   53 year old male with history of hypertension, chronic alcohol abuse, hypothyroidism presents for elevated blood pressure reading.  Patient reports high  blood pressure reading at work today in the 180s systolic.  Reports having a headache this morning when he woke up.  Took Tylenol  and the headache went away.  He did have multiple alcoholic beverages yesterday.  He takes losartan 100 mg daily for hypertension.  He says his blood pressure is normally in the 150 systolic.  Current BP 181/94.  Recheck is 186/95.  Exam normal today.  Included multiple previous blood pressure readings which are all elevated.  Reviewed multiple previous cardiology notes which show that patient was on 100 mg losartan.  He last saw cardiology in April of last year.  He was advised to take amlodipine  5 mg if blood pressure is over 120/90.  Patient says he has not been taking amlodipine  at all and has only been taking losartan.  He denies any missed doses.  Will resume patient on amlodipine  5 mg and have him continue losartan 100 mg.  Advised him to keep a log of blood pressures.  Has an appointment with PCP on 10/27/2023.  Advised him to keep appointment.  Medication may need to be adjusted at that time.  Discussed decreasing alcohol consumption.  Reviewed ED precautions.   Final Clinical Impressions(s) / UC Diagnoses   Final diagnoses:  Essential hypertension  Bradycardia  Alcohol abuse     Discharge Instructions      -Continue taking losartan. - If your blood pressure is over 140/90 take amlodipine . - Keep a log of your blood pressures and take to your appointment with your PCP on 10/27/2023. -Cut back on drinking alcohol and try to start exercising and increasing fluids.  Decrease dietary sodium. - If you have any associated severe headaches, dizziness, weakness, chest pain, palpitations, shortness of breath, swelling in your legs please contact 911 or go to the ER.     ED Prescriptions     Medication Sig Dispense Auth. Provider   amLODipine  (NORVASC ) 5 MG tablet Take 1 tablet (5 mg total) by mouth daily. 30 tablet Belen Zwahlen B, PA-C      PDMP not  reviewed this encounter.   Arvis Jolan NOVAK, PA-C 10/12/23 [phone]

## 2023-10-12 NOTE — Discharge Instructions (Signed)
-  Continue taking losartan. - If your blood pressure is over 140/90 take amlodipine . - Keep a log of your blood pressures and take to your appointment with your PCP on 10/27/2023. -Cut back on drinking alcohol and try to start exercising and increasing fluids.  Decrease dietary sodium. - If you have any associated severe headaches, dizziness, weakness, chest pain, palpitations, shortness of breath, swelling in your legs please contact 911 or go to the ER.

## 2023-10-12 NOTE — ED Triage Notes (Signed)
 Hypertension started last night. Patient was drinking. Take BP meds in the morning. No chest pain. Had a headache that's resolved after taking tylenol . Hx of hypertension.
